# Patient Record
Sex: Male | Born: 1995 | Race: White | Hispanic: No | Marital: Single | Smoking: Never smoker
Health system: Southern US, Community
[De-identification: ages and names within clinical notes are randomized; demographics above are authoritative.]

## PROBLEM LIST (undated history)

## (undated) DIAGNOSIS — J45909 Unspecified asthma, uncomplicated: Secondary | ICD-10-CM

## (undated) DIAGNOSIS — Q780 Osteogenesis imperfecta: Secondary | ICD-10-CM

## (undated) HISTORY — PX: FRACTURE SURGERY: SHX138

## (undated) HISTORY — PX: KNEE SURGERY: SHX244

---
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
## 2019-01-08 ENCOUNTER — Other Ambulatory Visit: Payer: Self-pay

## 2019-01-08 ENCOUNTER — Emergency Department (HOSPITAL_COMMUNITY)
Admission: EM | Admit: 2019-01-08 | Discharge: 2019-01-08 | Disposition: A | Discharge status: Other Acute Inpatient Hospital | Payer: 59 | Attending: Emergency Medicine | Admitting: Emergency Medicine

## 2019-01-08 ENCOUNTER — Emergency Department (HOSPITAL_COMMUNITY): Payer: 59

## 2019-01-08 ENCOUNTER — Encounter (HOSPITAL_COMMUNITY): Payer: Self-pay | Admitting: Emergency Medicine

## 2019-01-08 DIAGNOSIS — M25561 Pain in right knee: Secondary | ICD-10-CM | POA: Diagnosis not present

## 2019-01-08 DIAGNOSIS — Y999 Unspecified external cause status: Secondary | ICD-10-CM | POA: Diagnosis not present

## 2019-01-08 DIAGNOSIS — W109XXA Fall (on) (from) unspecified stairs and steps, initial encounter: Secondary | ICD-10-CM | POA: Insufficient documentation

## 2019-01-08 DIAGNOSIS — S82452A Displaced comminuted fracture of shaft of left fibula, initial encounter for closed fracture: Secondary | ICD-10-CM | POA: Diagnosis not present

## 2019-01-08 DIAGNOSIS — Y92038 Other place in apartment as the place of occurrence of the external cause: Secondary | ICD-10-CM | POA: Insufficient documentation

## 2019-01-08 DIAGNOSIS — J45909 Unspecified asthma, uncomplicated: Secondary | ICD-10-CM | POA: Diagnosis not present

## 2019-01-08 DIAGNOSIS — Y9301 Activity, walking, marching and hiking: Secondary | ICD-10-CM | POA: Insufficient documentation

## 2019-01-08 DIAGNOSIS — S8992XA Unspecified injury of left lower leg, initial encounter: Secondary | ICD-10-CM | POA: Diagnosis present

## 2019-01-08 DIAGNOSIS — W19XXXA Unspecified fall, initial encounter: Secondary | ICD-10-CM

## 2019-01-08 MED ORDER — FENTANYL CITRATE (PF) 100 MCG/2ML IJ SOLN
INTRAMUSCULAR | Status: AC
  Filled 2019-01-08: qty 2

## 2019-01-08 MED ORDER — FENTANYL CITRATE (PF) 100 MCG/2ML IJ SOLN
75.0000 ug | INTRAMUSCULAR | Status: AC | PRN
  Administered 2019-01-08 (×3): 75 ug via INTRAVENOUS
  Filled 2019-01-08 (×2): qty 2

## 2019-01-08 NOTE — ED Triage Notes (Signed)
Pt comes to ed via ems, fall at 11 pm tonight ( miss judged the steps in his apartment walking  Down the stairs in the dark) . Pt has a hx of knee surgery, right side. Pt alert x 4. 20 in Left forearm, 50 mcg  fentyle given. Pain 5 out 10. Ems has knees bound because pt was shaking from pain.  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

## 2019-01-08 NOTE — ED Notes (Signed)
Spoke with Ed charge [facility]  [address], [place]  [phone] ---   Gave report/ they calling back to arrange transport

## 2019-01-08 NOTE — ED Provider Notes (Signed)
[facility]-EMERGENCY DEPT Provider Note   CSN: [id number] Arrival date & time: 01/08/19  0009     History   Chief Complaint Chief Complaint  Patient presents with   Fall    bilateral knee and ankle pain left     HPI William Mcgee is a 23 y.o. male with history of osteogenesis imperfecta Type 1 and asthma who presents to the emergency department by EMS with a chief complaint of fall that occurred just prior to arrival.  The patient reports that he was walking out of his apartment when he misjudged to the bottom of the steps and fell down several steps.  He has not been able to bear weight on the bilateral lower extremities since the injury.  He denies hitting his head, nausea, vomiting, or syncope.  He is having pain in his bilateral knees and left lower leg.  He denies numbness, weakness, hip pain, foot pain, back pain, or neck pain.  He was given 50 mcg of fentanyl in route by EMS.  EMS found his knees together because the patient was not shaking secondary to pain.  Denies alcohol or recreational drug use.  He is [facility], and his mother is requesting [facility].    The history is provided by the patient. No language interpreter was used.    Past Medical History:  Diagnosis Date   Asthma    Osteogenesis imperfecta     There are no active problems to display for this patient.   Past Surgical History:  Procedure Laterality Date   FRACTURE SURGERY          Home Medications    Prior to Admission medications   Not on File    Family History No family history on file.  Social History Social History   Tobacco Use   Smoking status: Never Smoker  Substance Use Topics   Alcohol use: Not on file   Drug use: Not on file     Allergies   Patient has no known allergies.   Review of Systems Review of Systems  Constitutional: Negative for appetite change and fever.  Respiratory: Negative for shortness of  breath.   Cardiovascular: Negative for chest pain.  Gastrointestinal: Negative for abdominal pain, diarrhea and vomiting.  Genitourinary: Negative for dysuria.  Musculoskeletal: Positive for arthralgias, gait problem and myalgias. Negative for back pain, joint swelling, neck pain and neck stiffness.  Skin: Positive for color change and wound. Negative for rash.  Allergic/Immunologic: Negative for immunocompromised state.  Neurological: Negative for dizziness, syncope, weakness, numbness and headaches.  Psychiatric/Behavioral: Negative for confusion.   Physical Exam Updated Vital Signs BP 125/82    Pulse 95    Temp 98.7 F (37.1 C)    Resp 19    SpO2 96%   Physical Exam Vitals signs and nursing note reviewed.  Constitutional:      Appearance: He is well-developed.  HENT:     Head: Normocephalic.  Eyes:     Conjunctiva/sclera: Conjunctivae normal.  Neck:     Musculoskeletal: Neck supple.  Cardiovascular:     Rate and Rhythm: Normal rate and regular rhythm.     Heart sounds: No murmur.  Pulmonary:     Effort: Pulmonary effort is normal.  Abdominal:     General: There is no distension.     Palpations: Abdomen is soft.  Musculoskeletal:        General: Tenderness present.     Comments: Range  of motion of the bilateral knees deferred secondary to pain.  Tender to palpation to the shaft of the left lower leg into the mid left thigh.  Diffusely tender to palpation over the left knee.  No obvious deformities.  Significantly tender to palpation to the right superior knee with disruption of the patella.  Significant pain with movement of the right knee.  No obvious deformity.  Bilateral ankles and hips are nontender to palpation.  DP and PT pulses are 2+ and symmetric.  Sensation is intact and equal to the bilateral lower extremities.  Independently moves all digits of the bilateral feet.  Skin:    General: Skin is warm and dry.  Neurological:     Mental Status: He is alert.    Psychiatric:        Behavior: Behavior normal.      ED Treatments / Results  Labs (all labs ordered are listed, but only abnormal results are displayed) Labs Reviewed - No data to display  EKG None  Radiology Dg Tibia/fibula Left  Result Date: 01/08/2019 CLINICAL DATA:  Fall EXAM: LEFT TIBIA AND FIBULA - 2 VIEW COMPARISON:  None. FINDINGS: Postsurgical changes at the proximal tibia. Infrapatellar soft tissue calcification, possible loose body. Acute comminuted fracture involving the midshaft of the fibula. 1/2 bone with medial displacement of distal fracture fragment. IMPRESSION: Acute comminuted and displaced fracture involving the midshaft of the fibula Electronically Signed   By: Donavan Foil M.D.   On: 01/08/2019 02:39   Dg Tibia/fibula Right  Result Date: 01/08/2019 CLINICAL DATA:  Fall EXAM: RIGHT TIBIA AND FIBULA - 2 VIEW COMPARISON:  None. FINDINGS: There is no evidence of fracture or other focal bone lesions. Soft tissues are unremarkable. Surgical anchors within the proximal tibia. Mild lateral deviation of the patella. IMPRESSION: No acute osseous abnormality.  Mild lateral deviation of the patella Electronically Signed   By: Donavan Foil M.D.   On: 01/08/2019 02:41   Dg Knee Complete 4 Views Left  Result Date: 01/08/2019 CLINICAL DATA:  Fall EXAM: LEFT KNEE - COMPLETE 4+ VIEW COMPARISON:  None FINDINGS: No fracture or malalignment. Surgical anchors in the proximal tibia. Small left knee effusion. Partially visualized comminuted fibular shaft fracture IMPRESSION: Partially visualized comminuted fibular shaft fracture. Small knee effusion Electronically Signed   By: Donavan Foil M.D.   On: 01/08/2019 02:35   Dg Knee Complete 4 Views Right  Addendum Date: 01/08/2019   ADDENDUM REPORT: 01/08/2019 02:41 ADDENDUM: Mild lateral deviation of the patella Electronically Signed   By: Donavan Foil M.D.   On: 01/08/2019 02:41   Result Date: 01/08/2019 CLINICAL DATA:  Fall EXAM:  RIGHT KNEE - COMPLETE 4+ VIEW COMPARISON:  11/29/2013 FINDINGS: Postsurgical changes of the proximal tibia. No acute displaced fracture or dislocation. Possible small calcified infrapatellar loose bodies. High-riding patella. Suspected small knee effusion IMPRESSION: 1. No acute osseous abnormality 2. High-riding patella. Possible small calcified infrapatellar loose bodies Electronically Signed: By: Donavan Foil M.D. On: 01/08/2019 02:37   Dg Femur Min 2 Views Left  Result Date: 01/08/2019 CLINICAL DATA:  Fall EXAM: LEFT FEMUR 2 VIEWS COMPARISON:  None. FINDINGS: There is no evidence of fracture or other focal bone lesions. Soft tissues are unremarkable. IMPRESSION: No acute osseous abnormality Electronically Signed   By: Donavan Foil M.D.   On: 01/08/2019 02:38   Dg Femur Min 2 Views Right  Result Date: 01/08/2019 CLINICAL DATA:  Fall EXAM: RIGHT FEMUR 2 VIEWS COMPARISON:  None. FINDINGS: There is  no evidence of fracture or other focal bone lesions. Soft tissues are unremarkable. High-riding patella at the knee. Small infrapatellar calcifications. IMPRESSION: No acute osseous abnormality. Electronically Signed   By: Jasmine PangKim  Fujinaga M.D.   On: 01/08/2019 02:40    Procedures Procedures (including critical care time)  Medications Ordered in ED Medications  fentaNYL (SUBLIMAZE) 100 MCG/2ML injection (  Not Given 01/08/19 0107)  fentaNYL (SUBLIMAZE) injection 75 mcg (75 mcg Intravenous Given 01/08/19 0539)     Initial Impression / Assessment and Plan / ED Course  I have reviewed the triage vital signs and the nursing notes.  Pertinent labs & imaging results that were available during my care of the patient were reviewed by me and considered in my medical decision making (see chart for details).        23 year old male with history of osteogenesis imperfecta Type 1 and asthma presenting by EMS after a fall from standing while walking down steps.  On exam, he is neurovascularly intact.  He is  tender to palpation over the left lower leg, left thigh, and over the right patella.  The patient was seen and independently evaluated by Dr. Preston FleetingGlick, attending physician.  Pain was initially controlled with fentanyl given by EMS, but pain is returning in the ER.  After 1 dose of fentanyl, pain has been well managed.  Imaging of the left fibula demonstrates an acute comminuted and displaced fracture involving the midshaft of the left fibula.  There is 1/2 bone width medial displacement of the distal fracture fragment.  Imaging of the right knee with high riding patella and suspected small knee effusion.  There are also possible small calcified infrapatellar loose bodies.  Imaging findings were discussed with the patient's mother who is requesting transfer to [facility] since the patient is established with orthopedic surgery at this facility.  We did discuss that orthopedic surgeons at our facility are able to care for the patient's injuries; however, she prefers the patient seek treatment with the orthopedic care team that he is already established with.  Spoke with [facility] and Dr. Delia ChimesSteven Vaslef, trauma surgery, will accept the patient for ED to ED transfer.  Per transfer coordinator, COVID-19 testing is not required at this time as he can [facility].    On re-evaluation to update the patient's mother, the patient's pain remains well controlled.  He remains hemodynamically stable and in no acute distress and safe for transfer to [facility] at this time.  Final Clinical Impressions(s) / ED Diagnoses   Final diagnoses:  Fall, initial encounter  Closed displaced comminuted fracture of shaft of left fibula, initial encounter  Acute pain of right knee    ED Discharge Orders    None       Barkley BoardsMcDonald, Santiago Graf A, PA-C 01/08/19 0612    Dione BoozeGlick, David, MD 01/08/19 [id number]    Dione BoozeGlick, David, MD 01/08/19 [phone]

## 2019-01-08 NOTE — ED Notes (Signed)
[facility] transport arrived  Pt moved over to Doctor, hospital signed by pt electronically

## 2019-07-09 ENCOUNTER — Emergency Department (HOSPITAL_COMMUNITY)
Admission: EM | Admit: 2019-07-09 | Discharge: 2019-07-09 | Disposition: A | Discharge status: Home / Self Care | Payer: 59 | Attending: Emergency Medicine | Admitting: Emergency Medicine

## 2019-07-09 ENCOUNTER — Emergency Department (HOSPITAL_COMMUNITY): Payer: 59

## 2019-07-09 ENCOUNTER — Other Ambulatory Visit: Payer: Self-pay

## 2019-07-09 DIAGNOSIS — Y939 Activity, unspecified: Secondary | ICD-10-CM | POA: Insufficient documentation

## 2019-07-09 DIAGNOSIS — M25561 Pain in right knee: Secondary | ICD-10-CM | POA: Insufficient documentation

## 2019-07-09 DIAGNOSIS — Y999 Unspecified external cause status: Secondary | ICD-10-CM | POA: Insufficient documentation

## 2019-07-09 DIAGNOSIS — W19XXXA Unspecified fall, initial encounter: Secondary | ICD-10-CM

## 2019-07-09 DIAGNOSIS — Y929 Unspecified place or not applicable: Secondary | ICD-10-CM | POA: Diagnosis not present

## 2019-07-09 DIAGNOSIS — W1830XA Fall on same level, unspecified, initial encounter: Secondary | ICD-10-CM | POA: Insufficient documentation

## 2019-07-09 DIAGNOSIS — R000 Tachycardia, unspecified: Secondary | ICD-10-CM | POA: Insufficient documentation

## 2019-07-09 DIAGNOSIS — J45909 Unspecified asthma, uncomplicated: Secondary | ICD-10-CM | POA: Insufficient documentation

## 2019-07-09 DIAGNOSIS — R52 Pain, unspecified: Secondary | ICD-10-CM

## 2019-07-09 DIAGNOSIS — M25571 Pain in right ankle and joints of right foot: Secondary | ICD-10-CM | POA: Diagnosis not present

## 2019-07-09 MED ORDER — OXYCODONE-ACETAMINOPHEN 5-325 MG PO TABS
1.0000 | ORAL_TABLET | Freq: Once | ORAL | Status: AC
  Administered 2019-07-09: 1 via ORAL
  Filled 2019-07-09: qty 1

## 2019-07-09 MED ORDER — OXYCODONE-ACETAMINOPHEN 5-325 MG PO TABS: 1.0000 | ORAL_TABLET | ORAL | 0 refills | Status: DC | PRN

## 2019-07-09 MED ORDER — FENTANYL CITRATE (PF) 100 MCG/2ML IJ SOLN
50.0000 ug | Freq: Once | INTRAMUSCULAR | Status: AC
  Administered 2019-07-09: 14:00:00 50 ug via INTRAVENOUS
  Filled 2019-07-09: qty 2

## 2019-07-09 MED ORDER — FENTANYL CITRATE (PF) 100 MCG/2ML IJ SOLN
50.0000 ug | Freq: Once | INTRAMUSCULAR | Status: AC
  Administered 2019-07-09: 50 ug via INTRAVENOUS
  Filled 2019-07-09: qty 2

## 2019-07-09 NOTE — ED Provider Notes (Signed)
[facility]-EMERGENCY DEPT Provider Note   CSN: [id number] Arrival date & time: 07/09/19  1224     History Chief Complaint  Patient presents with  . Knee Pain    William Mcgee is a 24 y.o. male.  The history is provided by the patient and medical records. No language interpreter was used.  Leg Pain Location:  Leg, knee and ankle Time since incident:  1 hour Injury: yes   Mechanism of injury: fall   Fall:    Fall occurred:  Standing   Impact surface:  Designer, fashion/clothing of impact:  Knees Leg location:  R lower leg Knee location:  R knee Ankle location:  R ankle Pain details:    Quality:  Aching   Radiates to:  Does not radiate   Severity:  Severe   Onset quality:  Sudden   Timing:  Constant   Progression:  Unchanged Chronicity:  Recurrent Prior injury to area:  Yes Relieved by:  Nothing Worsened by:  Bearing weight, extension, flexion and rotation Ineffective treatments:  None tried Associated symptoms: swelling   Associated symptoms: no back pain, no fatigue, no fever, no muscle weakness, no neck pain and no numbness   Risk factors: known bone disorder (osteogenesis imperfecta)        Past Medical History:  Diagnosis Date  . Asthma   . Osteogenesis imperfecta     There are no problems to display for this patient.   Past Surgical History:  Procedure Laterality Date  . FRACTURE SURGERY         No family history on file.  Social History   Tobacco Use  . Smoking status: Never Smoker  Substance Use Topics  . Alcohol use: Not on file  . Drug use: Not on file    Home Medications Prior to Admission medications   Not on File    Allergies    Patient has no known allergies.  Review of Systems   Review of Systems  Constitutional: Negative for chills, fatigue and fever.  HENT: Negative for congestion.   Respiratory: Negative for cough and chest tightness.   Cardiovascular: Negative for chest pain.  Gastrointestinal: Negative  for abdominal pain.  Genitourinary: Negative for flank pain.  Musculoskeletal: Negative for back pain, neck pain and neck stiffness.  Skin: Negative for rash and wound.  Neurological: Negative for headaches.  Psychiatric/Behavioral: Negative for agitation.  All other systems reviewed and are negative.   Physical Exam Updated Vital Signs BP (!) 142/89 (BP Location: Right Arm)   Pulse (!) 104   Temp 98.5 F (36.9 C)   Resp 16   SpO2 96%   Physical Exam Vitals and nursing note reviewed.  Constitutional:      General: He is not in acute distress.    Appearance: He is well-developed. He is not ill-appearing, toxic-appearing or diaphoretic.  HENT:     Head: Normocephalic and atraumatic.  Eyes:     Conjunctiva/sclera: Conjunctivae normal.  Cardiovascular:     Rate and Rhythm: Regular rhythm. Tachycardia present.     Heart sounds: No murmur.  Pulmonary:     Effort: Pulmonary effort is normal. No respiratory distress.     Breath sounds: Normal breath sounds.  Abdominal:     General: Abdomen is flat.     Palpations: Abdomen is soft.     Tenderness: There is no abdominal tenderness. There is no right CVA tenderness or left CVA tenderness.  Musculoskeletal:  General: Swelling, tenderness and signs of injury present.     Cervical back: Neck supple.       Legs:     Comments: Tenderness in the right knee, right shin, and right ankle.  Normal DP pulse.  Normal strength in the foot.  No tenderness in the hip.  No tenderness in the proximal thigh.  Scar on the knee but no laceration seen.  Swelling of the knee.  Skin:    General: Skin is warm and dry.     Capillary Refill: Capillary refill takes less than 2 seconds.     Findings: No erythema.  Neurological:     General: No focal deficit present.     Mental Status: He is alert.  Psychiatric:        Mood and Affect: Mood normal.     ED Results / Procedures / Treatments   Labs (all labs ordered are listed, but only abnormal  results are displayed) Labs Reviewed - No data to display  EKG None  Radiology DG Knee 1-2 Views Right  Result Date: 07/09/2019 CLINICAL DATA:  24 year old male with fall and right lower extremity pain. EXAM: RIGHT KNEE - 1-2 VIEW; RIGHT TIBIA AND FIBULA - 2 VIEW; RIGHT ANKLE - COMPLETE 3+ VIEW COMPARISON:  Left knee radiograph dated 11/29/2013. FINDINGS: No acute fracture identified. There is superiorly subluxed positioning of the patella. Clinical correlation is recommended. Anchor pins noted in the proximal tibia similar to prior radiograph. There is soft tissue swelling of the anterior knee and stranding and edema in the Hoffa's fat pad. A small suprapatellar effusion is noted. IMPRESSION: 1. No acute fracture. 2. Superiorly subluxed patella. 3. Soft tissue swelling of the anterior knee and stranding/edema of the Hoffa's fat pad. Electronically Signed   By: Anner Crete M.D.   On: 07/09/2019 15:28   DG Tibia/Fibula Right  Result Date: 07/09/2019 CLINICAL DATA:  24 year old male with fall and right lower extremity pain. EXAM: RIGHT KNEE - 1-2 VIEW; RIGHT TIBIA AND FIBULA - 2 VIEW; RIGHT ANKLE - COMPLETE 3+ VIEW COMPARISON:  Left knee radiograph dated 11/29/2013. FINDINGS: No acute fracture identified. There is superiorly subluxed positioning of the patella. Clinical correlation is recommended. Anchor pins noted in the proximal tibia similar to prior radiograph. There is soft tissue swelling of the anterior knee and stranding and edema in the Hoffa's fat pad. A small suprapatellar effusion is noted. IMPRESSION: 1. No acute fracture. 2. Superiorly subluxed patella. 3. Soft tissue swelling of the anterior knee and stranding/edema of the Hoffa's fat pad. Electronically Signed   By: Anner Crete M.D.   On: 07/09/2019 15:28   DG Ankle Complete Right  Result Date: 07/09/2019 CLINICAL DATA:  24 year old male with fall and right lower extremity pain. EXAM: RIGHT KNEE - 1-2 VIEW; RIGHT TIBIA AND  FIBULA - 2 VIEW; RIGHT ANKLE - COMPLETE 3+ VIEW COMPARISON:  Left knee radiograph dated 11/29/2013. FINDINGS: No acute fracture identified. There is superiorly subluxed positioning of the patella. Clinical correlation is recommended. Anchor pins noted in the proximal tibia similar to prior radiograph. There is soft tissue swelling of the anterior knee and stranding and edema in the Hoffa's fat pad. A small suprapatellar effusion is noted. IMPRESSION: 1. No acute fracture. 2. Superiorly subluxed patella. 3. Soft tissue swelling of the anterior knee and stranding/edema of the Hoffa's fat pad. Electronically Signed   By: Anner Crete M.D.   On: 07/09/2019 15:28    Procedures Procedures (including critical care time)  Medications Ordered in ED Medications  fentaNYL (SUBLIMAZE) injection 50 mcg (50 mcg Intravenous Given 07/09/19 1338)  fentaNYL (SUBLIMAZE) injection 50 mcg (50 mcg Intravenous Given 07/09/19 1425)  oxyCODONE-acetaminophen (PERCOCET/ROXICET) 5-325 MG per tablet 1 tablet (1 tablet Oral Given 07/09/19 1424)    ED Course  I have reviewed the triage vital signs and the nursing notes.  Pertinent labs & imaging results that were available during my care of the patient were reviewed by me and considered in my medical decision making (see chart for details).    MDM Rules/Calculators/A&P                      Jailin Moomaw is a 24 y.o. male with a past medical history significant for asthma and osteogenesis imperfecta who presents with right leg injury.  Patient reports that he had bilateral knee injury last year requiring surgery at [facility] and he says that he was just getting back to his baseline.  He reports he was walking carrying something having in the rain when he slipped and fell landing on his right knee shin and ankle bending backwards.  He reported immediate onset of pain and heard a loud pop.  He is concerned he hurt either his shin, knee, or ankle again.  He has not been able to bear  significant weight on it but he denies numbness or tingling.  He denies any weakness in the ankle or foot.  He denies lacerations or bleeding.  He denies any other injuries including no pain in his hips back head or chest.  He reports the pain is a 7 out of 10 in severity.  He does report the fentanyl helped him in the past with severe pain.  On exam, patient has swelling in his right knee with associated tenderness.  There was a scar in place that was tender.  No laceration seen.  Patient had tenderness down his tibia on the anterior side as well as pain in the ankle.  He did have palpable DP pulse as well as normal sensation and strength in the toes.  Lungs clear and chest nontender.  Abdomen nontender.  Low back nontender.  Hip nontender.  Had a shared decision-making conversation with patient we agreed to get x-rays of the knee, shin, and ankle.  Clinically I suspect he may have reinjured the knee or even displaced some hardware from his recent knee surgery last year at [facility].  Anticipate reassessment after his imaging to determine if he needs transfer to see orthopedics team at [facility] versus place in the knee immobilizer and that he can follow-up with his outpatient [facility] team.  Patient will be given fentanyl initially to help with the pain.  Of note, he denies any other symptoms including no recent Covid symptoms, fevers, chills, cough, or other complaints.  2:01 PM Patient's mother arrived and would like to take patient to [facility] today to see his orthopedic team as she is concerned that it would lead to repeat imaging today and later on this afternoon as she would anticipate transfer.  Patient is agreeable to this plan and he would rather get 1 more dose of pain medicine both through IV and orally and then knee immobilizer and crutches.  She will take him to [facility] for evaluation and imaging.  Patient agreed with this plan and I feel this is reasonable to help prevent radiation for this patient.  2:13 PM  I spoken with the mother who requested we contact [facility] to  let them know he would be coming.  This will likely end up being a transfer by personal vehicle but we will speak to [facility] orthopedics team to let them know he is coming.  2:39 PM Spoke with Dr. Elane Fritz at [facility] with orthopedics who requested images actually be performed here as if they are negative, he would likely be appropriate for outpatient follow-up with his orthopedics team rather than transfer tonight.  If there are any acute findings, they would agree with transfer for further advanced imaging and management.  Patient will get the x-rays and then we will discuss further management.  3:38 PM X-ray show evidence of superior subluxed patella likely indicating reinjury of the patient's patellar tendon.  Also soft tissue swelling but no acute fracture seen.  Tib-fib and ankle showed no acute injuries.  Will call back the [facility] transfer team as they requested a call after the images.  3:48 PM Just spoke with Dr. Elane Fritz again who recommends patient be discharged and then follows up with the [facility] orthopedics team as an outpatient. He said that if they needed to get seen tonight by the orthopedics team, they could come to the emergency department but it would not be as an official transfer. Family agrees with this plan. He will be given prescription for pain medicine and will be discharged.   Final Clinical Impression(s) / ED Diagnoses Final diagnoses:  Acute pain of right knee  Fall, initial encounter  Acute right ankle pain    Rx / DC Orders ED Discharge Orders         Ordered    oxyCODONE-acetaminophen (PERCOCET/ROXICET) 5-325 MG tablet  Every 4 hours PRN     07/09/19 1550          Clinical Impression: 1. Acute pain of right knee   2. Fall, initial encounter   3. Acute right ankle pain   4. Pain     Disposition: Discharge  Condition: Good  I have discussed the results, Dx and Tx plan with the pt(& family if  present). He/she/they expressed understanding and agree(s) with the plan. Discharge instructions discussed at great length. Strict return precautions discussed and pt &/or family have verbalized understanding of the instructions. No further questions at time of discharge.    New Prescriptions   OXYCODONE-ACETAMINOPHEN (PERCOCET/ROXICET) 5-325 MG TABLET    Take 1 tablet by mouth every 4 (four) hours as needed for severe pain.    Follow Up: Your [facility] orthopedics team        Bentley Haralson, Canary Brim, MD 07/09/19 [phone]

## 2019-07-09 NOTE — ED Triage Notes (Signed)
Pt reports pain to the right knee. Patient reports he fell and heard a pop

## 2019-07-09 NOTE — Discharge Instructions (Addendum)
Your images today showed evidence of a superiorly located patella which could possibly indicate reinjury of her patellar tendon. There was no fracture or dislocation of the other bones. Your ankle did not show fracture or dislocation. After our discussion with the [facility] orthopedics team, they felt it was appropriate for you to be discharged with a prescription for pain medicine, knee immobilization, crutches, and follow-up with your team as an outpatient however they also said that if you need to go to the emergency department at [facility], you could do so but not as an official transfer. After our discussion, we feel this is reasonable. If any symptoms change or worsen acutely, please return to nearest emergency department.

## 2019-07-09 NOTE — ED Notes (Signed)
Dr. Rush Landmark has spoke with a member of the [facility] orthopedics team. Apparently, patient is not being discharged or transferred until x-ray images are performed.

## 2019-08-09 ENCOUNTER — Ambulatory Visit: Payer: 59

## 2019-08-09 ENCOUNTER — Ambulatory Visit: Payer: 59 | Attending: Internal Medicine

## 2019-08-09 DIAGNOSIS — Z23 Encounter for immunization: Secondary | ICD-10-CM

## 2019-08-09 NOTE — Progress Notes (Signed)
   Covid-19 Vaccination Clinic  Name:  William Mcgee    MRN: [id number] DOB: 07/20/1995  08/09/2019  Mr. Memoli was observed post Covid-19 immunization for 15 minutes without incident. He was provided with Vaccine Information Sheet and instruction to access the V-Safe system.   Mr. Malecha was instructed to call 911 with any severe reactions post vaccine: Marland Kitchen Difficulty breathing  . Swelling of face and throat  . A fast heartbeat  . A bad rash all over body  . Dizziness and weakness   Immunizations Administered    Name Date Dose VIS Date Route   Pfizer COVID-19 Vaccine 08/09/2019  4:04 PM 0.3 mL 04/27/2019 Intramuscular   Manufacturer: [employer], [employer]   Lot: [id number]   NDC: [address]-0

## 2019-09-03 ENCOUNTER — Ambulatory Visit: Payer: 59 | Attending: Internal Medicine

## 2019-09-03 DIAGNOSIS — Z23 Encounter for immunization: Secondary | ICD-10-CM

## 2019-09-03 NOTE — Progress Notes (Signed)
   Covid-19 Vaccination Clinic  Name:  William Mcgee    MRN: [id number] DOB: [birth year]  09/03/2019  [name] was observed post Covid-19 immunization for 15 minutes without incident. He was provided with Vaccine Information Sheet and instruction to access the V-Safe system.   [name] was instructed to call 911 with any severe reactions post vaccine: Marland Kitchen Difficulty breathing  . Swelling of face and throat  . A fast heartbeat  . A bad rash all over body  . Dizziness and weakness   Immunizations Administered    Name Date Dose VIS Date Route   Pfizer COVID-19 Vaccine 09/03/2019  1:42 PM 0.3 mL 07/11/2018 Intramuscular   Manufacturer: [employer], [employer]   Lot: [id number]   NDC: [address]-1

## 2021-05-22 ENCOUNTER — Ambulatory Visit
Admission: EM | Admit: 2021-05-22 | Discharge: 2021-05-22 | Disposition: A | Discharge status: Home / Self Care | Payer: 59

## 2021-05-22 ENCOUNTER — Other Ambulatory Visit: Payer: Self-pay

## 2021-05-22 DIAGNOSIS — Z1152 Encounter for screening for COVID-19: Secondary | ICD-10-CM | POA: Diagnosis not present

## 2021-05-22 DIAGNOSIS — B349 Viral infection, unspecified: Secondary | ICD-10-CM | POA: Diagnosis not present

## 2021-05-22 LAB — POCT FASTING CBG KUC MANUAL ENTRY: POCT Glucose (KUC): 113 mg/dL — AB (ref 70–99)

## 2021-05-22 NOTE — ED Provider Notes (Signed)
[facility]    CSN: [id number] Arrival date & time: 05/22/21  1258      History   Chief Complaint Chief Complaint  Patient presents with   Shortness of Breath   Dizziness    HPI William Mcgee is a 26 y.o. male with history of asthma presents to urgent care today with complaints of dizziness and fatigue.  Patient states he has had extremely busy at work since after the holidays and has noticed some fatigue and occasional shortness of breath.  Patient states symptoms occur with little exertion and associated with occasional dizziness.  Patient states dizziness occurs when sitting to standing and when focusing intently on phone or computer.  Patient reports mild congestion and phlegm in chest, headache.  He denies any facial pain, change in smell or taste, cough, ST, abdominal pain, N/V/D, recent fever or chills.  Patient reports history of asthma.  He has used inhaler twice over the last few days with maybe some relief in symptoms.  Patient concerned he has COVID due to recent exposure.   Past Medical History:  Diagnosis Date   Asthma    Osteogenesis imperfecta     There are no problems to display for this patient.   Past Surgical History:  Procedure Laterality Date   FRACTURE SURGERY         Home Medications    Prior to Admission medications   Medication Sig Start Date End Date Taking? Authorizing Provider  albuterol (VENTOLIN HFA) 108 (90 Base) MCG/ACT inhaler Inhale into the lungs. 07/01/20 07/01/21 Yes [provider]  methylphenidate 54 MG PO CR tablet Take 54 mg by mouth every morning. 04/30/21   [provider]  oxyCODONE-acetaminophen (PERCOCET/ROXICET) 5-325 MG tablet Take 1 tablet by mouth every 4 (four) hours as needed for severe pain. 07/09/19   Tegeler, Canary Brim, MD    Family History History reviewed. No pertinent family history.  Social History Social History   Tobacco Use   Smoking status: Never     Allergies    Patient has no known allergies.   Review of Systems As stated in HPI otherwise negative   Physical Exam Triage Vital Signs ED Triage Vitals  Enc Vitals Group     BP 05/22/21 1425 129/72     Pulse Rate 05/22/21 1425 63     Resp 05/22/21 1425 18     Temp 05/22/21 1425 98 F (36.7 C)     Temp Source 05/22/21 1425 Oral     SpO2 05/22/21 1425 99 %     Weight --      Height --      Head Circumference --      Peak Flow --      Pain Score 05/22/21 1423 2     Pain Loc --      Pain Edu? --      Excl. in [place]? --    No data found.  Updated Vital Signs BP 129/72 (BP Location: Left Arm)    Pulse 63    Temp 98 F (36.7 C) (Oral)    Resp 18    SpO2 99%   Visual Acuity Right Eye Distance:   Left Eye Distance:   Bilateral Distance:    Right Eye Near:   Left Eye Near:    Bilateral Near:     Physical Exam Constitutional:      General: He is not in acute distress.    Appearance: He is well-developed and normal weight. He  is not ill-appearing or toxic-appearing.  HENT:     Mouth/Throat:     Mouth: Mucous membranes are moist.     Pharynx: No pharyngeal swelling or oropharyngeal exudate.  Eyes:     Extraocular Movements: Extraocular movements intact.  Cardiovascular:     Rate and Rhythm: Normal rate and regular rhythm.     Heart sounds: No murmur heard.   No friction rub. No gallop.  Pulmonary:     Effort: Pulmonary effort is normal. No tachypnea, accessory muscle usage or respiratory distress.     Breath sounds: Normal breath sounds. No stridor.  Abdominal:     General: Bowel sounds are normal.     Palpations: Abdomen is soft.  Musculoskeletal:        General: Normal range of motion.     Cervical back: Normal range of motion.  Lymphadenopathy:     Cervical: No cervical adenopathy.  Skin:    General: Skin is warm and dry.  Neurological:     General: No focal deficit present.     Mental Status: He is alert and oriented to person, place, and time.  Psychiatric:         Behavior: Behavior normal.     UC Treatments / Results  Labs (all labs ordered are listed, but only abnormal results are displayed) Labs Reviewed  POCT FASTING CBG [facility] MANUAL ENTRY - Abnormal; Notable for the following components:      Result Value   POCT Glucose ([facility]) 113 (*)    All other components within normal limits  NOVEL CORONAVIRUS, NAA   Narrative:    Performed at:  [address] [place], [place]  [id number] Lab Director: Jolene Schimke MD, Phone:  [phone]  SARS-COV-2, NAA 2 DAY TAT   Narrative:    Performed at:  [address] [facility] [address], [place], [place]  [id number] Lab Director: Jolene Schimke MD, Phone:  [phone]    EKG Sinus rhythm at 63 bpm, no acute ischemia, no T wave inversion, no reciprocal changes  Radiology No results found.  Procedures Procedures (including critical care time)  Medications Ordered in UC Medications - No data to display  Initial Impression / Assessment and Plan / UC Course  I have reviewed the triage vital signs and the nursing notes.  Pertinent labs & imaging results that were available during my care of the patient were reviewed by me and considered in my medical decision making (see chart for details).  Dizziness, fatigue -Symptoms ongoing x1 week with associated congestion and cough along with increased job stress -Exam reassuring.  No evidence of bacterial etiology or acute asthma exacerbation requiring antibiotics or steroid treatment -CBG and EKG reassuring -COVID PCR testing sent.  Otherwise, rest, push fluids symptomatic treatment with Tylenol and/or Motrin, okay to use albuterol as previously prescribed. -Follow-up PCP 1 to 2 weeks or sooner for persistent or worsening symptoms  Reviewed expections re: course of current medical issues. Questions answered. Outlined signs and symptoms indicating need for more acute intervention. Pt verbalized understanding. AVS given  Final  Clinical Impressions(s) / UC Diagnoses   Final diagnoses:  Viral illness  Encounter for screening for COVID-19     Discharge Instructions      I am reassured today by your exam and EKG.  I suspect you have a viral illness that does not require any antibiotics.  Your COVID PCR testing has been sent.  Results should be available on your MyChart in 2 to  3 days.  Otherwise, rest, drink plenty of fluids and continue vitamins as prior to visit.  Please follow-up for any worsening or persistent symptoms     ED Prescriptions   None    PDMP not reviewed this encounter.   Rolla EtienneSmith, Callaway Hardigree E, NP 05/25/21 [phone]

## 2021-05-22 NOTE — Discharge Instructions (Addendum)
I am reassured today by your exam and EKG.  I suspect you have a viral illness that does not require any antibiotics.  Your COVID PCR testing has been sent.  Results should be available on your MyChart in 2 to 3 days.  Otherwise, rest, drink plenty of fluids and continue vitamins as prior to visit.  Please follow-up for any worsening or persistent symptoms

## 2021-05-22 NOTE — ED Triage Notes (Signed)
Pt reports he has asthma, and states he began having chest tightness, dizziness and some SOB with activity. Patient concerned that he has covid.

## 2021-05-23 LAB — SARS-COV-2, NAA 2 DAY TAT

## 2021-05-23 LAB — NOVEL CORONAVIRUS, NAA: SARS-CoV-2, NAA: NOT DETECTED

## 2021-06-01 ENCOUNTER — Emergency Department (INDEPENDENT_AMBULATORY_CARE_PROVIDER_SITE_OTHER)
Admission: RE | Admit: 2021-06-01 | Discharge: 2021-06-01 | Disposition: A | Discharge status: Home / Self Care | Payer: PRIVATE HEALTH INSURANCE | Source: Ambulatory Visit

## 2021-06-01 ENCOUNTER — Other Ambulatory Visit: Payer: Self-pay

## 2021-06-01 ENCOUNTER — Ambulatory Visit: Payer: Self-pay

## 2021-06-01 VITALS — BP 142/78 | HR 79 | Temp 98.2°F | Resp 18 | Wt 186.0 lb

## 2021-06-01 DIAGNOSIS — N4889 Other specified disorders of penis: Secondary | ICD-10-CM

## 2021-06-01 NOTE — ED Triage Notes (Signed)
Pt states he has had a small red spot on his right testicle for months. Last night felt like it popped and he started having more pain and swelling.

## 2021-06-01 NOTE — Discharge Instructions (Addendum)
The bump that you are worried about is benign and should not cause any problems.  Try to leave it alone

## 2021-06-01 NOTE — ED Provider Notes (Signed)
[name]    CSN: [id number] Arrival date & time: 06/01/21  1441      History   Chief Complaint Chief Complaint  Patient presents with   Groin Pain    HPI William Mcgee is a 26 y.o. male.   HPI  Patient has a bump on his penis that he is concerned about.  Its been there for some time but he popped it yesterday and now is more sore.  No dysuria.  No concern for sexually transmitted disease.  No penile discharge  Past Medical History:  Diagnosis Date   Asthma    Osteogenesis imperfecta     There are no problems to display for this patient.   Past Surgical History:  Procedure Laterality Date   FRACTURE SURGERY     KNEE SURGERY         Home Medications    Prior to Admission medications   Medication Sig Start Date End Date Taking? Authorizing Provider  albuterol (VENTOLIN HFA) 108 (90 Base) MCG/ACT inhaler Inhale into the lungs. 07/01/20 07/01/21  [provider]  methylphenidate 54 MG PO CR tablet Take 54 mg by mouth every morning. 04/30/21   [provider]    Family History History reviewed. No pertinent family history.  Social History Social History   Tobacco Use   Smoking status: Never  Vaping Use   Vaping Use: Never used  Substance Use Topics   Alcohol use: Not Currently   Drug use: Not Currently     Allergies   Patient has no known allergies.   Review of Systems Review of Systems See HPI  Physical Exam Triage Vital Signs ED Triage Vitals  Enc Vitals Group     BP 06/01/21 1502 (!) 142/78     Pulse Rate 06/01/21 1502 79     Resp 06/01/21 1502 18     Temp 06/01/21 1502 (!) 79 F (26.1 C)     Temp Source 06/01/21 1502 Oral     SpO2 06/01/21 1502 98 %     Weight 06/01/21 1506 186 lb (84.4 kg)     Height --      Head Circumference --      Peak Flow --      Pain Score 06/01/21 1505 2     Pain Loc --      Pain Edu? --      Excl. in [place]? --    No data found.  Updated Vital Signs BP (!) 142/78 (BP  Location: Right Arm)    Pulse 79    Temp 98.2 F (36.8 C) (Oral)    Resp 18    Wt 84.4 kg    SpO2 98%       Physical Exam Constitutional:      General: He is not in acute distress.    Appearance: He is well-developed.  HENT:     Head: Normocephalic and atraumatic.  Eyes:     Conjunctiva/sclera: Conjunctivae normal.     Pupils: Pupils are equal, round, and reactive to light.  Cardiovascular:     Rate and Rhythm: Normal rate.  Pulmonary:     Effort: Pulmonary effort is normal. No respiratory distress.  Abdominal:     General: There is no distension.     Palpations: Abdomen is soft.  Genitourinary:    Pubic Area: No rash.      Penis: Normal and uncircumcised.      Testes: Normal.        Right:  Mass or tenderness not present.        Left: Mass or tenderness not present.     Epididymis:     Right: Normal.     Left: Normal.     Tanner stage (genital): 5.     Comments: There are 2 small round pearly nodules on the underside of the penis near the base. Musculoskeletal:        General: Normal range of motion.     Cervical back: Normal range of motion.  Skin:    General: Skin is warm and dry.  Neurological:     Mental Status: He is alert.     UC Treatments / Results  Labs (all labs ordered are listed, but only abnormal results are displayed) Labs Reviewed - No data to display  EKG   Radiology No results found.  Procedures Procedures (including critical care time)  Medications Ordered in UC Medications - No data to display  Initial Impression / Assessment and Plan / UC Course  I have reviewed the triage vital signs and the nursing notes.  Pertinent labs & imaging results that were available during my care of the patient were reviewed by me and considered in my medical decision making (see chart for details).    Final Clinical Impressions(s) / UC Diagnoses   Final diagnoses:  Sebaceous cyst of penis     Discharge Instructions      The bump that you are  worried about is benign and should not cause any problems.  Try to leave it alone   ED Prescriptions   None    PDMP not reviewed this encounter.   Raylene Everts, MD 06/01/21 [phone]

## 2021-07-13 IMAGING — DX DG TIBIA/FIBULA 2V*R*
2 series · 2 of 2 positions shown · non-contrast
Comparison: Left knee radiograph dated 11/29/2013.

CLINICAL DATA: 23-year-old male with fall and right lower extremity
pain.

EXAM:
RIGHT KNEE - 1-2 VIEW; RIGHT TIBIA AND FIBULA - 2 VIEW; RIGHT ANKLE
- COMPLETE 3+ VIEW

[tibia ap]
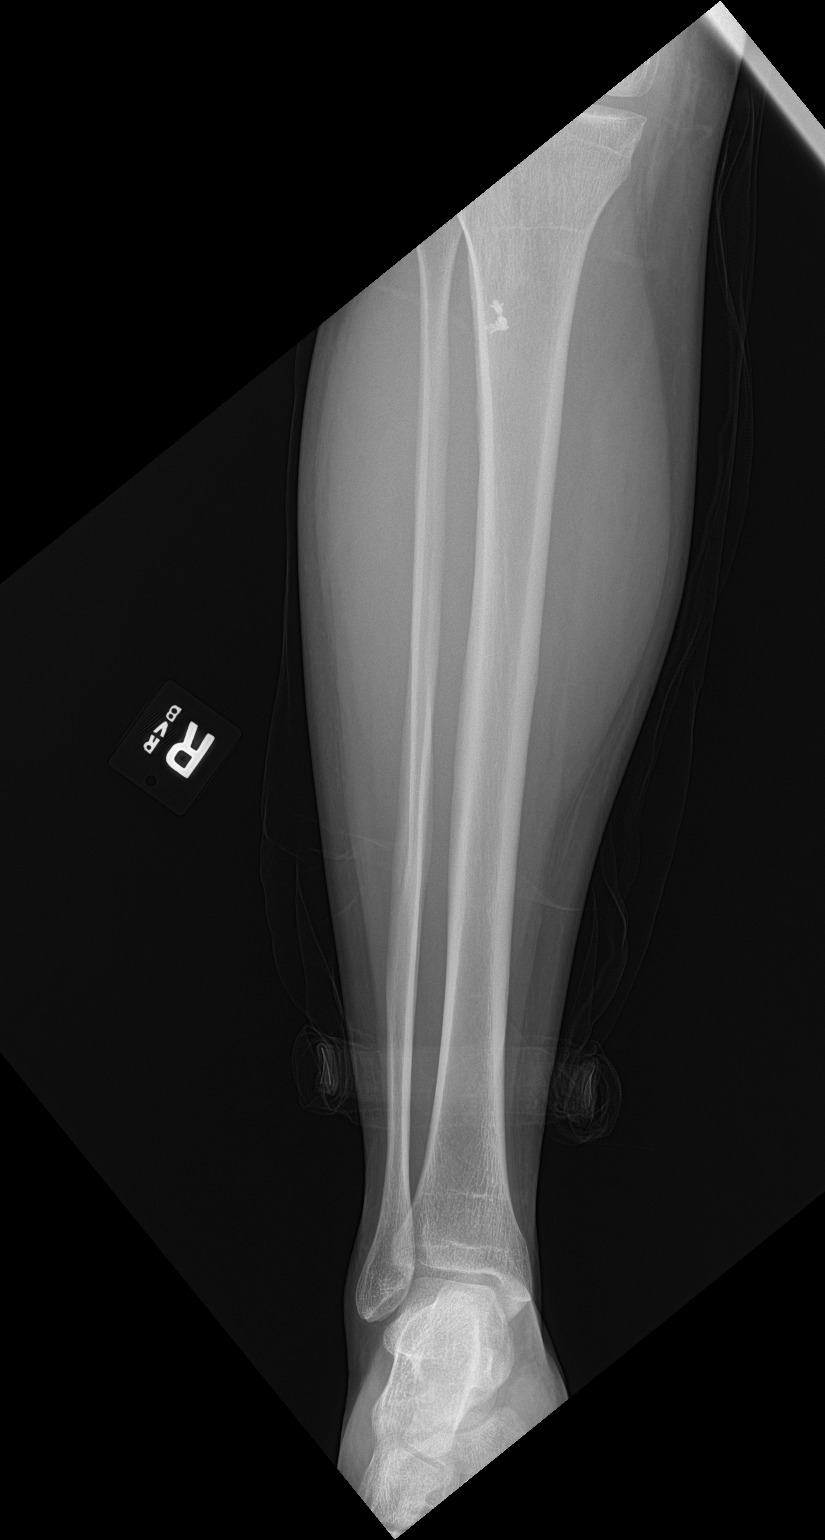

[tibia lat]
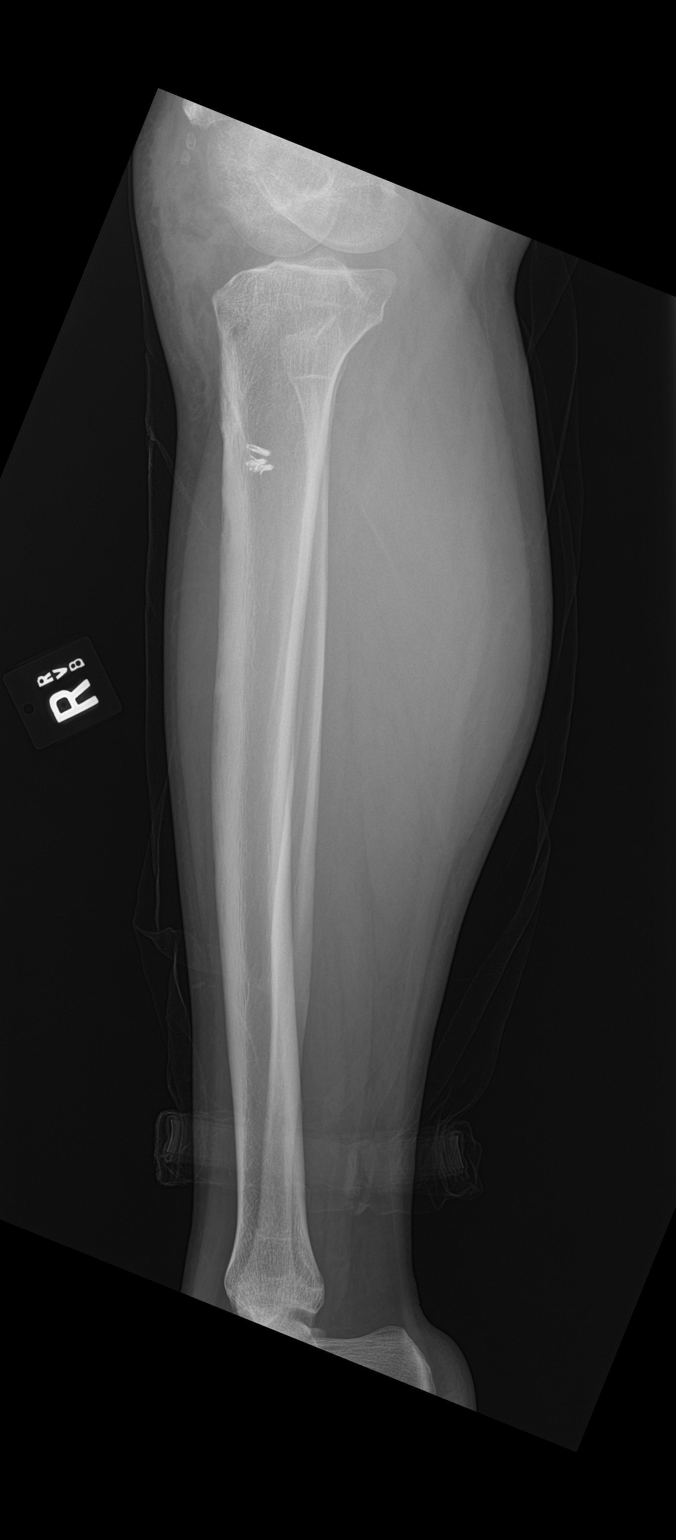

[2 of 2 positions shown; findings below may reference images not displayed]

FINDINGS: No acute fracture identified. There is superiorly subluxed
positioning of the patella. Clinical correlation is recommended.
Anchor pins noted in the proximal tibia similar to prior radiograph.
There is soft tissue swelling of the anterior knee and stranding and
edema in the Hoffa's fat pad. A small suprapatellar effusion is
noted.
IMPRESSION: 1. No acute fracture.
2. Superiorly subluxed patella.
3. Soft tissue swelling of the anterior knee and stranding/edema of
the Hoffa's fat pad.

## 2021-07-13 IMAGING — DX DG ANKLE COMPLETE 3+V*R*
3 series · 3 of 3 positions shown · non-contrast
Comparison: Left knee radiograph dated 11/29/2013.

CLINICAL DATA: 23-year-old male with fall and right lower extremity
pain.

EXAM:
RIGHT KNEE - 1-2 VIEW; RIGHT TIBIA AND FIBULA - 2 VIEW; RIGHT ANKLE
- COMPLETE 3+ VIEW

[ankle ap]
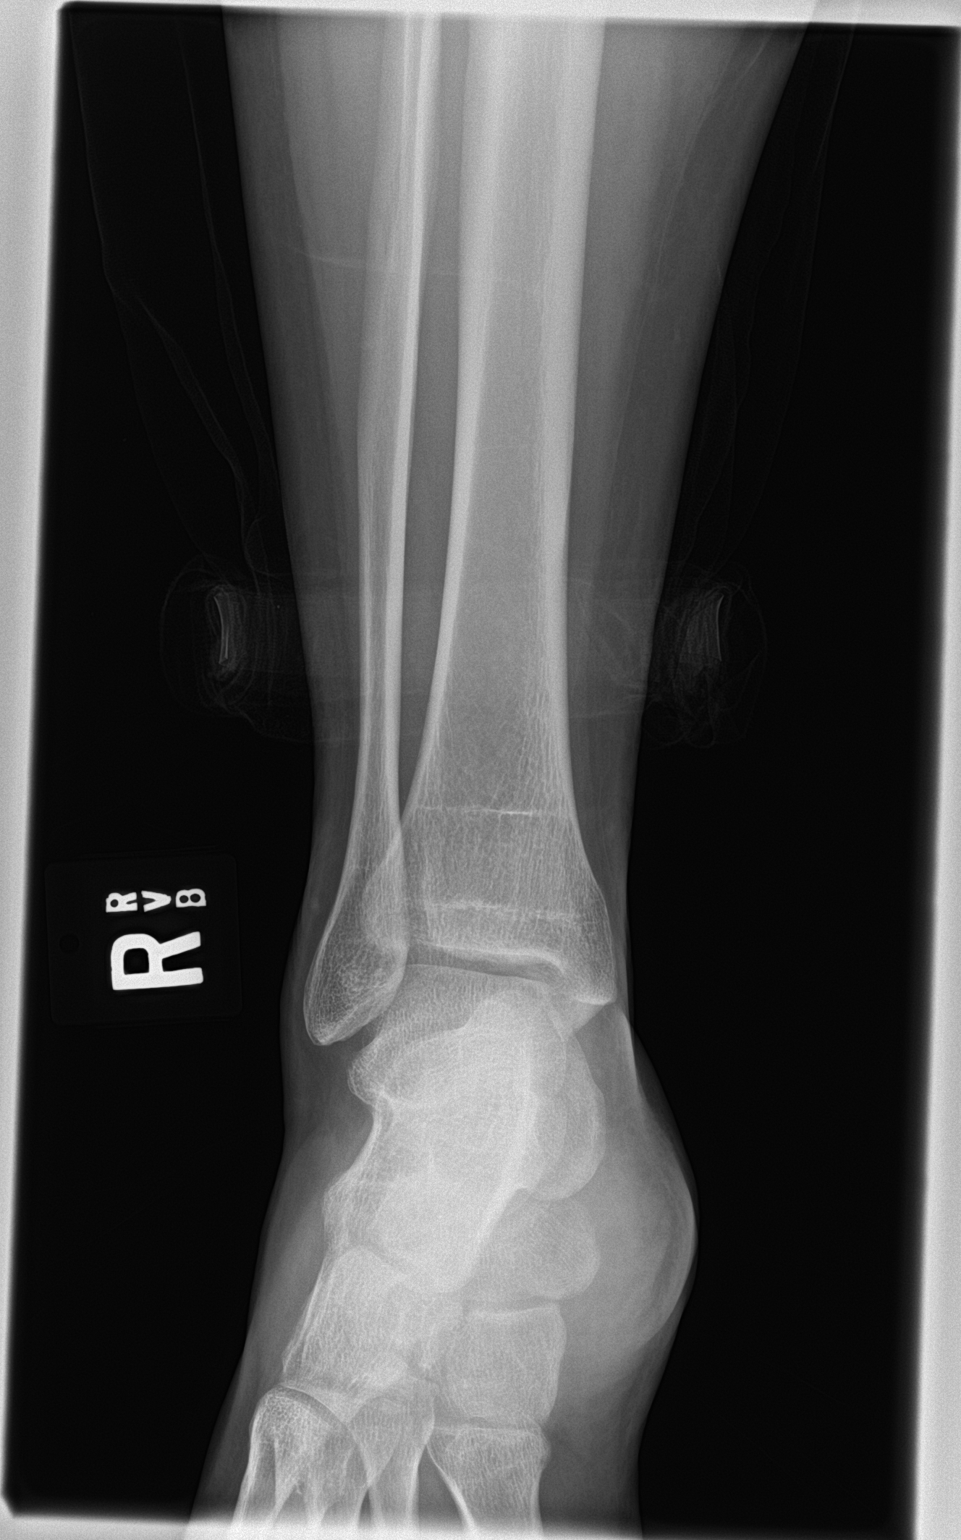

[ankle obl]
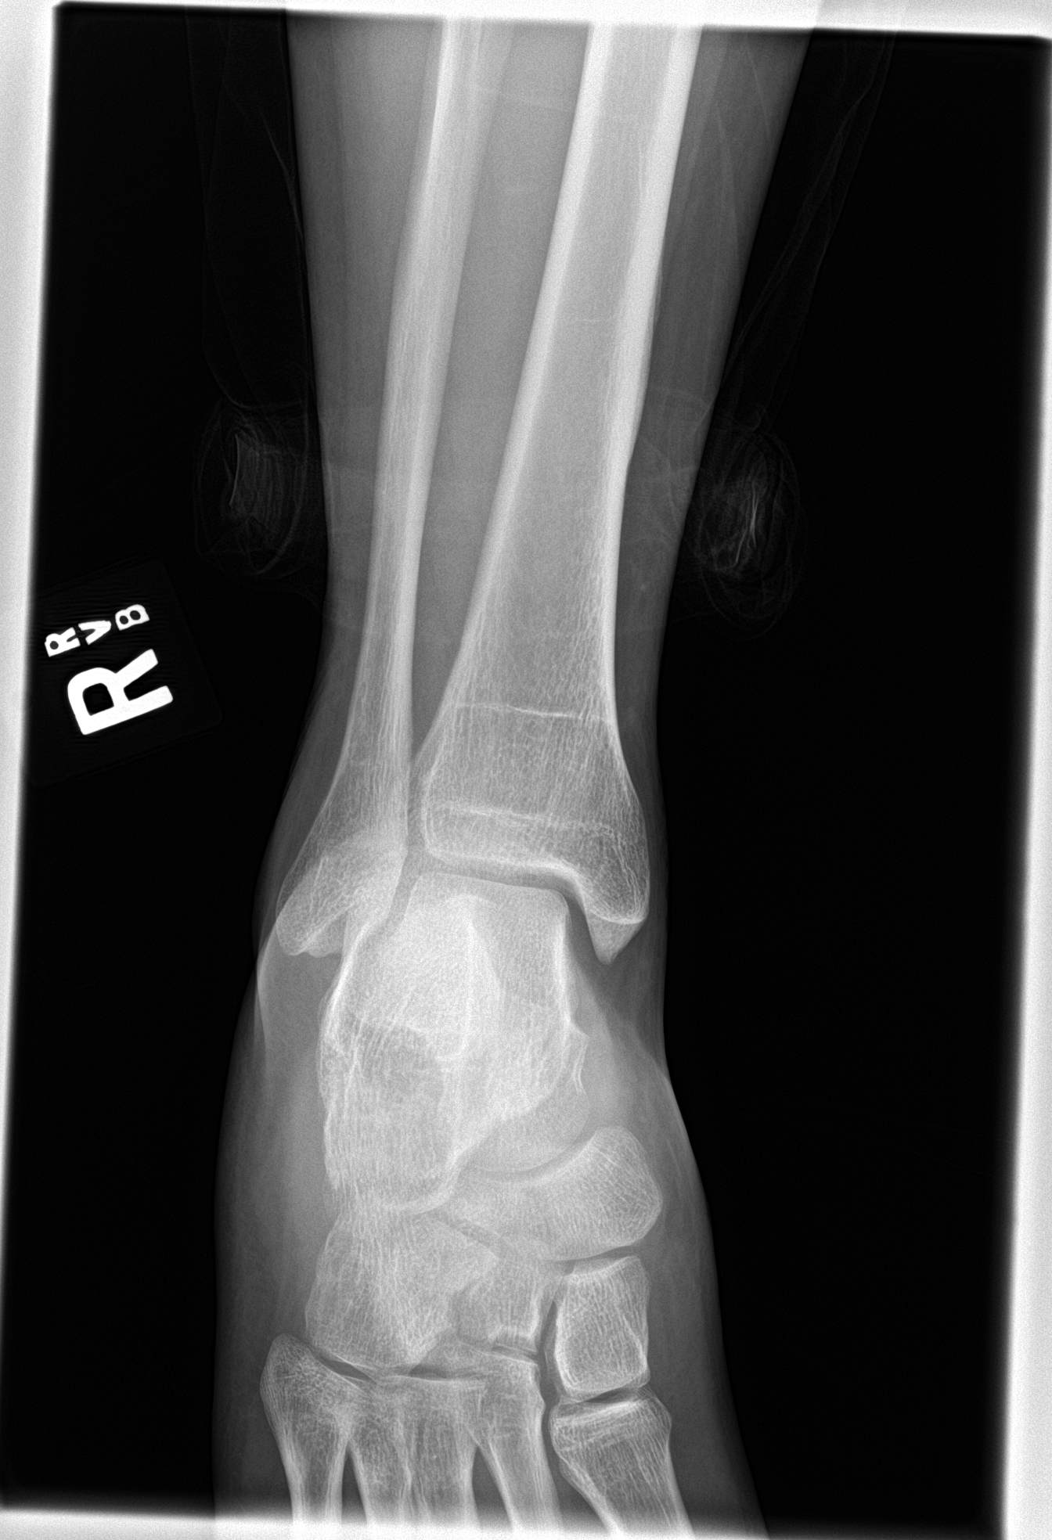

[ankle lat]
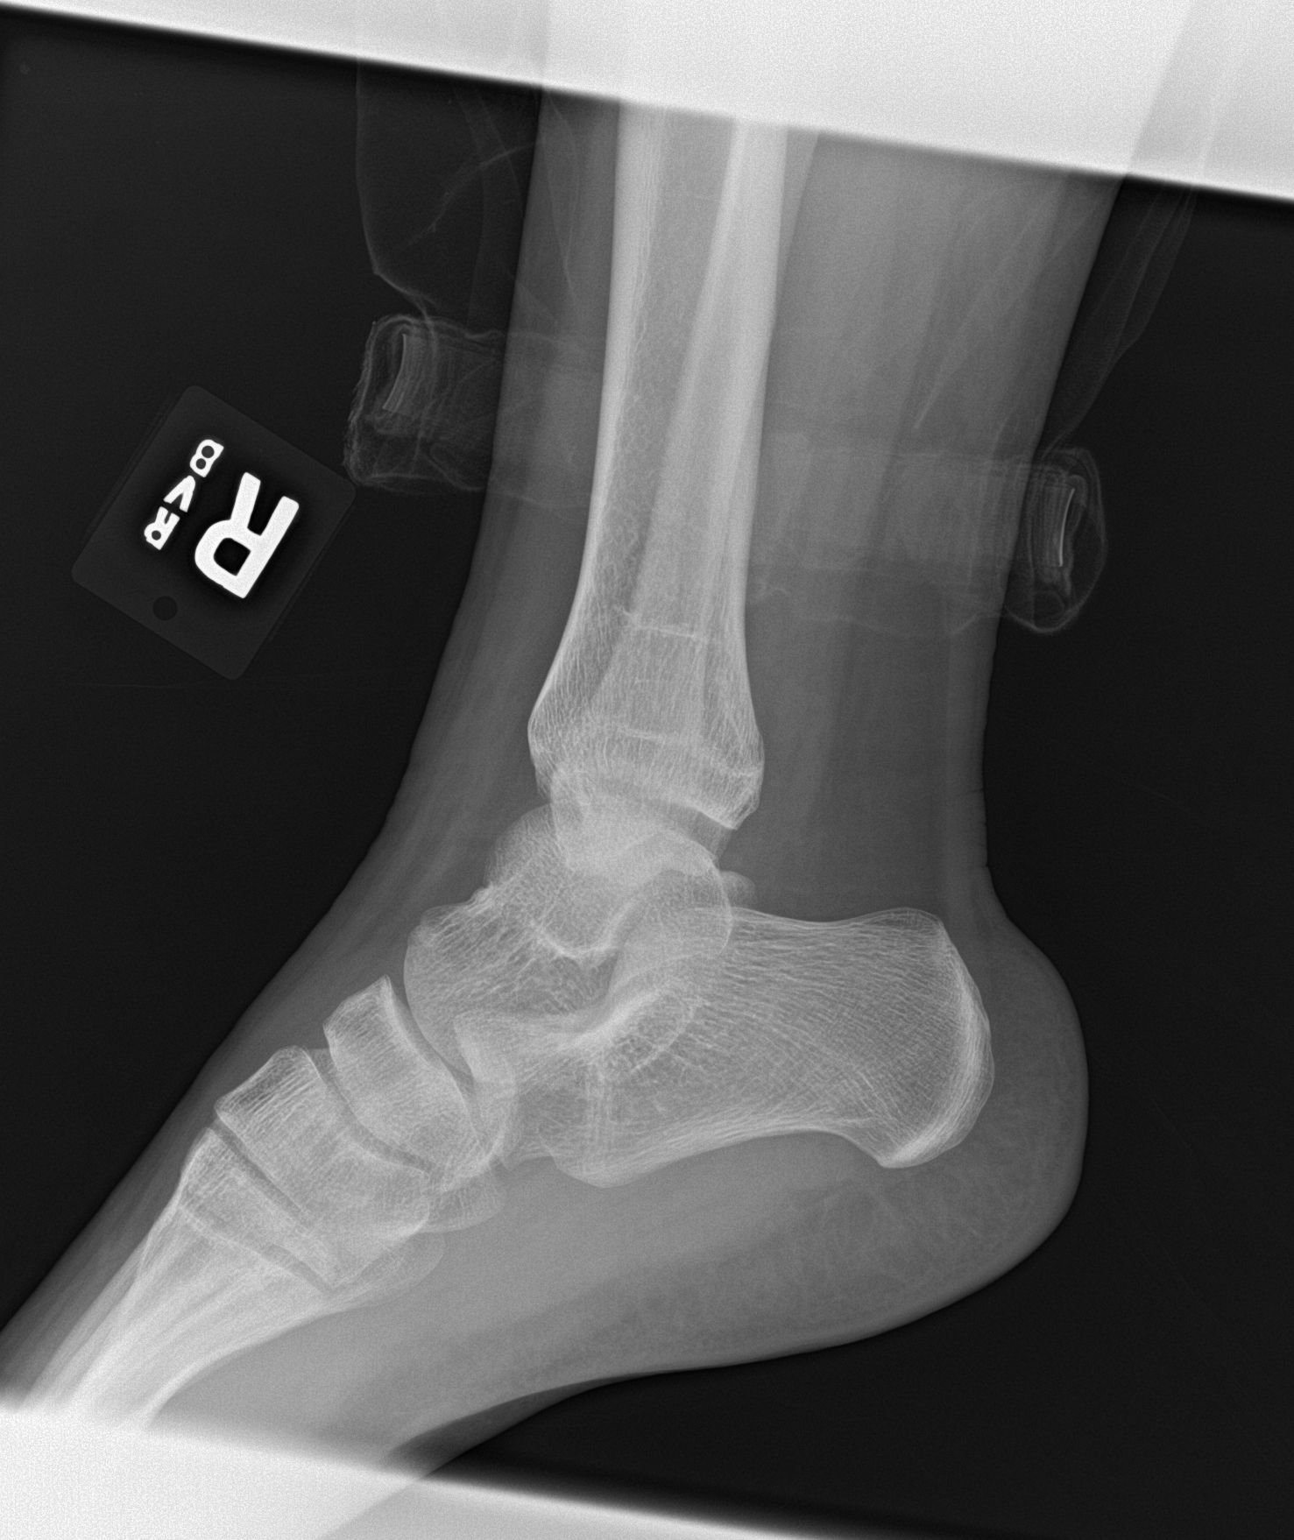

[3 of 3 positions shown; findings below may reference images not displayed]

FINDINGS: No acute fracture identified. There is superiorly subluxed
positioning of the patella. Clinical correlation is recommended.
Anchor pins noted in the proximal tibia similar to prior radiograph.
There is soft tissue swelling of the anterior knee and stranding and
edema in the Hoffa's fat pad. A small suprapatellar effusion is
noted.
IMPRESSION: 1. No acute fracture.
2. Superiorly subluxed patella.
3. Soft tissue swelling of the anterior knee and stranding/edema of
the Hoffa's fat pad.

## 2021-11-25 ENCOUNTER — Ambulatory Visit
Admission: RE | Admit: 2021-11-25 | Discharge: 2021-11-25 | Disposition: A | Discharge status: Home / Self Care | Payer: 59 | Source: Ambulatory Visit

## 2021-11-25 VITALS — BP 130/81 | HR 74 | Temp 98.7°F | Resp 18

## 2021-11-25 DIAGNOSIS — R112 Nausea with vomiting, unspecified: Secondary | ICD-10-CM | POA: Diagnosis not present

## 2021-11-25 NOTE — ED Provider Notes (Signed)
William Mcgee    CSN: [id number] Arrival date & time: 11/25/21  1034      History   Chief Complaint Chief Complaint  Patient presents with   Nausea    I went into work yesterday and was very nauseous after being behind on taking a medication. However I threw up several times which hasn't happened before and I still feel nauseous today 7/12 - Entered by patient    HPI William Mcgee is a 26 y.o. male.   HPI Patient presents today with ongoing nausea after an episode of vomiting twice yesterday.  Patient reports missing 1 dose of his Lexapro and initially thought this may be the cause of the nausea. He also endorses increase stress over the last 3 months.  He has had no diarrhea, no abdominal pain and denies any recent illness.  He did feel clammy with mild dizziness yesterday and was sent home from work due to his symptoms.  He was able to eat dinner but has had some intermittent nausea.  Being that he was sent home from working this today patient is reporting that he will need a work excuse.  He has been negative of vomiting today although continues to have some nausea following eating breakfast this morning. Past Medical History:  Diagnosis Date   Asthma    Osteogenesis imperfecta     There are no problems to display for this patient.   Past Surgical History:  Procedure Laterality Date   FRACTURE SURGERY     KNEE SURGERY         Home Medications    Prior to Admission medications   Medication Sig Start Date End Date Taking? Authorizing Provider  escitalopram (LEXAPRO) 10 MG tablet Take 10 mg by mouth daily. 11/23/21  Yes [provider]  methylphenidate 54 MG PO CR tablet Take 54 mg by mouth every morning. 04/30/21  Yes [provider]  albuterol (VENTOLIN HFA) 108 (90 Base) MCG/ACT inhaler Inhale into the lungs. 07/01/20 07/01/21  [provider]    Family History History reviewed. No pertinent family history.  Social History Social  History   Tobacco Use   Smoking status: Never   Smokeless tobacco: Never  Vaping Use   Vaping Use: Some days  Substance Use Topics   Alcohol use: Yes   Drug use: Not Currently     Allergies   Patient has no known allergies.   Review of Systems Review of Systems Pertinent negatives listed in HPI  Physical Exam Triage Vital Signs ED Triage Vitals  Enc Vitals Group     BP 11/25/21 1105 130/81     Pulse Rate 11/25/21 1105 74     Resp 11/25/21 1105 18     Temp 11/25/21 1105 98.7 F (37.1 C)     Temp Source 11/25/21 1105 Oral     SpO2 11/25/21 1105 98 %     Weight --      Height --      Head Circumference --      Peak Flow --      Pain Score 11/25/21 1104 0     Pain Loc --      Pain Edu? --      Excl. in [place]? --    No data found.  Updated Vital Signs BP 130/81 (BP Location: Left Arm)   Pulse 74   Temp 98.7 F (37.1 C) (Oral)   Resp 18   SpO2 98%   Visual Acuity Right Eye  Distance:   Left Eye Distance:   Bilateral Distance:    Right Eye Near:   Left Eye Near:    Bilateral Near:     Physical Exam Vitals reviewed.  Constitutional:      Appearance: Normal appearance.  Cardiovascular:     Rate and Rhythm: Normal rate and regular rhythm.  Pulmonary:     Effort: Pulmonary effort is normal.     Breath sounds: Normal breath sounds and air entry.  Neurological:     Mental Status: He is alert.  Psychiatric:        Attention and Perception: Attention and perception normal.        Mood and Affect: Mood is anxious.        Speech: Speech normal.        Behavior: Behavior is cooperative.        Thought Content: Thought content normal.        Cognition and Memory: Cognition and memory normal.        Judgment: Judgment normal.    UC Treatments / Results  Labs (all labs ordered are listed, but only abnormal results are displayed) Labs Reviewed - No data to display  EKG   Radiology No results found.  Procedures Procedures (including critical care  time)  Medications Ordered in UC Medications - No data to display  Initial Impression / Assessment and Plan / UC Course  I have reviewed the triage vital signs and the nursing notes.  Pertinent labs & imaging results that were available during my care of the patient were reviewed by me and considered in my medical decision making (see chart for details).    Nausea and vomiting in adult,no concern for an acute abdomen as abdominal exam is unremarkable.  Suspect if history of anxiety that nausea and vomiting is likely psychogenic.  Advised patient if the symptoms continue to follow-up with his mental health medication prescriber as he has been on his Lexapro for over 6 months and he may need a titration in dose for better management of his anxiety. Work note provided. Patient declined Zofran. Final Clinical Impressions(s) / UC Diagnoses   Final diagnoses:  Nausea and vomiting in adult   Discharge Instructions   None    ED Prescriptions   None    PDMP not reviewed this encounter.   Bing Neighbors, FNP 11/25/21 1152

## 2021-11-25 NOTE — ED Triage Notes (Signed)
Pt c/o nausea that started yesterday. He vomited twice and thinks it due to the fact that he didn't take his lexapro as scheduled.

## 2023-04-19 ENCOUNTER — Ambulatory Visit: Payer: BC Managed Care – PPO | Admitting: Urology

## 2023-04-19 ENCOUNTER — Ambulatory Visit
Admission: EM | Admit: 2023-04-19 | Discharge: 2023-04-19 | Disposition: A | Discharge status: Home / Self Care | Payer: PRIVATE HEALTH INSURANCE | Attending: Emergency Medicine | Admitting: Emergency Medicine

## 2023-04-19 ENCOUNTER — Encounter: Payer: Self-pay | Admitting: Urology

## 2023-04-19 VITALS — BP 130/76 | HR 73 | Ht 67.0 in | Wt 210.0 lb

## 2023-04-19 DIAGNOSIS — J069 Acute upper respiratory infection, unspecified: Secondary | ICD-10-CM

## 2023-04-19 DIAGNOSIS — N529 Male erectile dysfunction, unspecified: Secondary | ICD-10-CM

## 2023-04-19 DIAGNOSIS — J029 Acute pharyngitis, unspecified: Secondary | ICD-10-CM | POA: Diagnosis not present

## 2023-04-19 DIAGNOSIS — R6882 Decreased libido: Secondary | ICD-10-CM | POA: Diagnosis not present

## 2023-04-19 LAB — GROUP A STREP BY PCR: Group A Strep by PCR: NOT DETECTED

## 2023-04-19 LAB — SARS CORONAVIRUS 2 BY RT PCR: SARS Coronavirus 2 by RT PCR: NEGATIVE

## 2023-04-19 MED ORDER — TADALAFIL 5 MG PO TABS: 2.5000 mg | ORAL_TABLET | Freq: Every day | ORAL | 6 refills | Status: DC

## 2023-04-19 MED ORDER — CLOMIPHENE CITRATE 50 MG PO TABS: 25.0000 mg | ORAL_TABLET | Freq: Every day | ORAL | 6 refills | Status: DC

## 2023-04-19 MED ORDER — DOXYCYCLINE HYCLATE 100 MG PO CAPS: 100.0000 mg | ORAL_CAPSULE | Freq: Two times a day (BID) | ORAL | 0 refills | Status: AC

## 2023-04-19 NOTE — Patient Instructions (Signed)
Please stop by our lab a week before your next appointment, labs must be drawn in the morning prior to 10:30am. Our lab is walk in only therefore no appointment is needed, the lab is located in [address] and is open 8am - 5pm.

## 2023-04-19 NOTE — Discharge Instructions (Signed)
Take antibiotic as directed. Rest,push fluids, follow up with PCP. May take OTC meds for symptom management(chloraseptic throat lozenges, dayquil,nyquil,etc). Return as needed.

## 2023-04-19 NOTE — ED Provider Notes (Signed)
[facility]    CSN: [id number] Arrival date & time: 04/19/23  1144      History   Chief Complaint Chief Complaint  Patient presents with   Sore Throat   Chills    HPI William Mcgee is a 27 y.o. male.   27 year old male pt, William Mcgee, presents to urgent care for evaluation of sore throat and chills for 6 days.  Patient states he has had some nasal congestion as well as sinus pressure.  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  The history is provided by the patient. No language interpreter was used.    Past Medical History:  Diagnosis Date   Asthma    Osteogenesis imperfecta     Patient Active Problem List   Diagnosis Date Noted   Acute URI 04/19/2023   Sore throat 04/19/2023    Past Surgical History:  Procedure Laterality Date   FRACTURE SURGERY     KNEE SURGERY         Home Medications    Prior to Admission medications   Medication Sig Start Date End Date Taking? Authorizing Provider  clomiPHENE (CLOMID) 50 MG tablet Take 0.5 tablets (25 mg total) by mouth daily. 04/19/23  Yes Sondra Come, MD  doxycycline (VIBRAMYCIN) 100 MG capsule Take 1 capsule (100 mg total) by mouth 2 (two) times daily for 7 days. 04/19/23 04/26/23 Yes [place] Yearby, Para March, NP  escitalopram (LEXAPRO) 10 MG tablet Take 10 mg by mouth daily. 11/23/21  Yes [provider]  methylphenidate 54 MG PO CR tablet Take 54 mg by mouth every morning. 04/30/21  Yes [provider]  pantoprazole (PROTONIX) 40 MG tablet Take 40 mg by mouth daily. 09/14/22 09/14/23 Yes [provider]  sildenafil (REVATIO) 20 MG tablet Take 60-100 mg by mouth as needed.   Yes [provider]  tadalafil (CIALIS) 5 MG tablet Take 0.5 tablets (2.5 mg total) by mouth daily. 04/19/23  Yes Sondra Come, MD    Family History History reviewed. No pertinent family history.  Social History Social History   Tobacco Use   Smoking status:  Never    Passive exposure: Never   Smokeless tobacco: Never  Vaping Use   Vaping status: Some Days  Substance Use Topics   Alcohol use: Yes   Drug use: Not Currently     Allergies   Patient has no known allergies.   Review of Systems Review of Systems  Constitutional:  Positive for chills and fever.  HENT:  Positive for congestion, postnasal drip, sinus pressure, sinus pain and sore throat.   Respiratory:  Positive for cough.   All other systems reviewed and are negative.    Physical Exam Triage Vital Signs ED Triage Vitals  Encounter Vitals Group     BP 04/19/23 1153 (!) 144/75     Systolic BP Percentile --      Diastolic BP Percentile --      Pulse Rate 04/19/23 1153 70     Resp 04/19/23 1153 16     Temp 04/19/23 1153 98.5 F (36.9 C)     Temp Source 04/19/23 1153 Oral     SpO2 04/19/23 1153 97 %     Weight 04/19/23 1152 210 lb (95.3 kg)     Height 04/19/23 1152 5\' 7"  (1.702 m)     Head Circumference --      Peak Flow --      Pain Score 04/19/23 1155  6     Pain Loc --      Pain Education --      Exclude from Growth Chart --    No data found.  Updated Vital Signs BP (!) 144/75 (BP Location: Left Arm)   Pulse 70   Temp 98.5 F (36.9 C) (Oral)   Resp 16   Ht 5\' 7"  (1.702 m)   Wt 210 lb (95.3 kg)   SpO2 97%   BMI 32.89 kg/m   Visual Acuity Right Eye Distance:   Left Eye Distance:   Bilateral Distance:    Right Eye Near:   Left Eye Near:    Bilateral Near:     Physical Exam Vitals and nursing note reviewed.  Constitutional:      General: He is not in acute distress.    Appearance: He is well-developed. He is not ill-appearing or toxic-appearing.  HENT:     Head: Normocephalic.     Right Ear: Tympanic membrane is retracted.     Left Ear: Tympanic membrane is retracted.     Nose: Mucosal edema and congestion present.     Mouth/Throat:     Lips: Pink.     Mouth: Mucous membranes are moist.     Pharynx: Oropharynx is clear. Uvula midline.   Eyes:     General: Lids are normal.     Conjunctiva/sclera: Conjunctivae normal.     Pupils: Pupils are equal, round, and reactive to light.  Cardiovascular:     Rate and Rhythm: Normal rate and regular rhythm.     Heart sounds: Normal heart sounds.  Pulmonary:     Effort: Pulmonary effort is normal. No respiratory distress.     Breath sounds: Normal breath sounds and air entry. No decreased breath sounds or wheezing.  Abdominal:     General: There is no distension.     Palpations: Abdomen is soft.  Musculoskeletal:        General: Normal range of motion.     Cervical back: Normal range of motion.  Skin:    General: Skin is warm and dry.     Findings: No rash.  Neurological:     General: No focal deficit present.     Mental Status: He is alert and oriented to person, place, and time.     GCS: GCS eye subscore is 4. GCS verbal subscore is 5. GCS motor subscore is 6.     Cranial Nerves: No cranial nerve deficit.     Sensory: No sensory deficit.  Psychiatric:        Speech: Speech normal.        Behavior: Behavior normal. Behavior is cooperative.      UC Treatments / Results  Labs (all labs ordered are listed, but only abnormal results are displayed) Labs Reviewed  GROUP A STREP BY PCR  SARS CORONAVIRUS 2 BY RT PCR    EKG   Radiology No results found.  Procedures Procedures (including critical care time)  Medications Ordered in UC Medications - No data to display  Initial Impression / Assessment and Plan / UC Course  I have reviewed the triage vital signs and the nursing notes.  Pertinent labs & imaging results that were available during my care of the patient were reviewed by me and considered in my medical decision making (see chart for details).    Discussed exam findings and plan of care with patient, strict go to ER precautions given.   Patient verbalized understanding to this provider.  Ddx: Acute URI, sore throat, allergies, viral illness Final  Clinical Impressions(s) / UC Diagnoses   Final diagnoses:  Acute URI  Sore throat     Discharge Instructions      Take antibiotic as directed. Rest,push fluids, follow up with PCP. May take OTC meds for symptom management(chloraseptic throat lozenges, dayquil,nyquil,etc). Return as needed.      ED Prescriptions     Medication Sig Dispense Auth. Provider   doxycycline (VIBRAMYCIN) 100 MG capsule Take 1 capsule (100 mg total) by mouth 2 (two) times daily for 7 days. 14 capsule Ellana Kawa, Para March, NP      PDMP not reviewed this encounter.   Clancy Gourd, NP 04/19/23 1311

## 2023-04-19 NOTE — Progress Notes (Signed)
   04/19/23 11:04 AM   Gilmer Mor 05-31-1995 [id number]  CC: ED, borderline testosterone values  HPI: 27 year old male who reports about a year of ED and low libido.  Things improved somewhat after decreasing stress and change in a relationship.  He has been on sildenafil through PCP with improvement in his ED.  He had borderline testosterone levels of 335 in May 2024 and 392 in October 2024.  He does not think he is interested in future pregnancies at this time.  He does extensive weight training.  He is on Lexapro and methylphenidate.   PMH: Past Medical History:  Diagnosis Date   Asthma    Osteogenesis imperfecta     Surgical History: Past Surgical History:  Procedure Laterality Date   FRACTURE SURGERY     KNEE SURGERY       Social History:  reports that he has never smoked. He has never been exposed to tobacco smoke. He has never used smokeless tobacco. He reports current alcohol use. He reports that he does not currently use drugs.  Physical Exam: BP 130/76   Pulse 73   Ht 5\' 7"  (1.702 m)   Wt 210 lb (95.3 kg)   BMI 32.89 kg/m    Constitutional:  Alert and oriented, No acute distress. Cardiovascular: No clubbing, cyanosis, or edema. Respiratory: Normal respiratory effort, no increased work of breathing. GI: Abdomen is soft, nontender, nondistended, no abdominal masses   Laboratory Data: Reviewed, see HPI   Assessment & Plan:   27 year old male with low libido and ED and borderline testosterone levels of 335 and 392.  We reviewed the AUA guidelines regarding testosterone evaluation and management, and that 2 values > 300 are required to initiate testosterone replacement.  We reviewed the risks and benefits of Clomid extensively, and he was strongly interested in a trial of Clomid.  He was also interested in a trial of Cialis daily to replace the sildenafil.  We also reviewed behavioral and dietary strategies to improve testosterone levels.  Trial of Clomid 25  mg daily and Cialis 2.5 mg daily RTC 6 weeks testosterone, estradiol, LH prior  Legrand Rams, MD 04/19/2023  [facility] [address] [place], [place] [address] [phone]

## 2023-04-19 NOTE — ED Triage Notes (Signed)
Pt c/o sore throat & chills x6 days.

## 2023-05-02 NOTE — Telephone Encounter (Signed)
Pt called triage stating that he took 1 whole tab of Cialis 5mg . He also states the pharmacy only gave him 15 tablets, I advised he would need to speak to [facility] because Dr. Richardo Hanks sent in 30 tablets, and he has refills.

## 2023-05-16 ENCOUNTER — Ambulatory Visit (INDEPENDENT_AMBULATORY_CARE_PROVIDER_SITE_OTHER): Payer: BC Managed Care – PPO

## 2023-05-16 ENCOUNTER — Ambulatory Visit
Admission: EM | Admit: 2023-05-16 | Discharge: 2023-05-16 | Disposition: A | Discharge status: Home / Self Care | Payer: BC Managed Care – PPO | Attending: Emergency Medicine | Admitting: Emergency Medicine

## 2023-05-16 DIAGNOSIS — R051 Acute cough: Secondary | ICD-10-CM

## 2023-05-16 DIAGNOSIS — R058 Other specified cough: Secondary | ICD-10-CM

## 2023-05-16 DIAGNOSIS — J4521 Mild intermittent asthma with (acute) exacerbation: Secondary | ICD-10-CM | POA: Diagnosis not present

## 2023-05-16 MED ORDER — PROMETHAZINE-DM 6.25-15 MG/5ML PO SYRP: 5.0000 mL | ORAL_SOLUTION | Freq: Four times a day (QID) | ORAL | 0 refills | Status: AC | PRN

## 2023-05-16 MED ORDER — AEROCHAMBER MV MISC: 1 refills | Status: AC

## 2023-05-16 MED ORDER — PREDNISONE 10 MG (21) PO TBPK: ORAL_TABLET | ORAL | 0 refills | Status: AC

## 2023-05-16 MED ORDER — ALBUTEROL SULFATE HFA 108 (90 BASE) MCG/ACT IN AERS: 2.0000 | INHALATION_SPRAY | RESPIRATORY_TRACT | 0 refills | Status: AC | PRN

## 2023-05-16 NOTE — ED Provider Notes (Incomplete)
HPI  SUBJECTIVE:  William Mcgee is a 27 y.o. male who presents with ***  Patient was seen here on 12/3 for URI, COVID, flu negative.  Sent home on 1 week of doxycycline.  Patient has a past medical history of asthma and osteogenesis imperfecta  Past Medical History:  Diagnosis Date   Asthma    Osteogenesis imperfecta     Past Surgical History:  Procedure Laterality Date   FRACTURE SURGERY     KNEE SURGERY      Family History  Problem Relation Age of Onset   Asthma Mother    Cancer Mother     Social History   Tobacco Use   Smoking status: Never    Passive exposure: Never   Smokeless tobacco: Never  Vaping Use   Vaping status: Some Days  Substance Use Topics   Alcohol use: Yes    Comment: occ   Drug use: Not Currently    No current facility-administered medications for this encounter.  Current Outpatient Medications:    clomiPHENE (CLOMID) 50 MG tablet, Take 0.5 tablets (25 mg total) by mouth daily., Disp: 30 tablet, Rfl: 6   escitalopram (LEXAPRO) 10 MG tablet, Take 10 mg by mouth daily., Disp: , Rfl:    methylphenidate 54 MG PO CR tablet, Take 54 mg by mouth every morning., Disp: , Rfl:    tadalafil (CIALIS) 5 MG tablet, Take 0.5 tablets (2.5 mg total) by mouth daily., Disp: 30 tablet, Rfl: 6   pantoprazole (PROTONIX) 40 MG tablet, Take 40 mg by mouth daily., Disp: , Rfl:    sildenafil (REVATIO) 20 MG tablet, Take 60-100 mg by mouth as needed., Disp: , Rfl:   No Known Allergies   ROS  As noted in HPI.   Physical Exam  BP (!) 114/49 (BP Location: Right Arm)   Pulse 78   Temp 98.6 F (37 C) (Oral)   Resp 18   SpO2 97%  *** Constitutional: Well developed, well nourished, no acute distress Eyes:  EOMI, conjunctiva normal bilaterally HENT: Normocephalic, atraumatic,mucus membranes moist.  No nasal congestion.  No maxillary, frontal sinus tenderness.  No postnasal drip. Respiratory: Normal inspiratory effort, diffuse expiratory wheezing, worse on the  left, prolonged expiratory phase.  No anterior, lateral chest wall tenderness Cardiovascular: Normal rate, regular rhythm, no murmurs rubs or gallops GI: nondistended skin: No rash, skin intact Musculoskeletal: no deformities Neurologic: Alert & oriented x 3, no focal neuro deficits Psychiatric: Speech and behavior appropriate   ED Course   Medications - No data to display  No orders of the defined types were placed in this encounter.   No results found for this or any previous visit (from the past 24 hours). No results found.  ED Clinical Impression  No diagnosis found.   ED Assessment/Plan   {The patient has been seen in Urgent Care in the last 3 years. :1}  Previous records reviewed.  As noted in HPI.  Will get chest x-ray to rule out some residual pneumonia given duration of cough and asymmetric wheezing..  I suspect asthma exacerbation from recent URI.  Home with 6 day prednisone taper, regularly scheduled albuterol inhaler with a spacer for 4 days, then as needed thereafter, Promethazine DM.  Work note for today.  Reviewed imaging independently.  No acute cardiopulmonary disease as read by me formal radiology report pending.  Discussed with patient that we will contact him if radiology overread differs enough from mine and we need to change management.   Reviewed  radiology report.  No pneumonia consistent with my read.  See radiology report for full details.  Plan as above.  Discussed  imaging, MDM, treatment plan, and plan for follow-up with patient. Discussed sn/sx that should prompt return to the ED. patient agrees with plan.   No orders of the defined types were placed in this encounter.     *This clinic note was created using Dragon dictation software. Therefore, there may be occasional mistakes despite careful proofreading.  ?

## 2023-05-16 NOTE — ED Triage Notes (Signed)
Pt seen here 12/03 for URI.  Had some improvement with abx but has never fully recovered.  Reports continued dry, raspy cough, fatigue, and mild SOB esp with exertion.    Has been using Sudafed with no improvement.

## 2023-05-16 NOTE — Discharge Instructions (Addendum)
I did not appreciate any pneumonia on your x-ray.  We will contact you if radiology sees a pneumonia, and we will: The appropriate medication.  In the meantime, 2 puffs from your albuterol inhaler using your spacer every 4 hours for 2 days, then every 6 hours for 2 days, then as needed.  Finish the prednisone, even if you feel better.  Promethazine DM for cough.  Marland Kitchen

## 2023-05-17 ENCOUNTER — Encounter: Payer: Self-pay | Admitting: Emergency Medicine

## 2023-06-06 ENCOUNTER — Other Ambulatory Visit: Payer: Self-pay | Admitting: Urology

## 2023-06-06 NOTE — Addendum Note (Signed)
Addended by: Marchelle Folks on: 06/06/2023 08:38 AM   Modules accepted: Orders

## 2023-06-07 ENCOUNTER — Ambulatory Visit: Payer: BC Managed Care – PPO | Admitting: Urology

## 2023-06-07 ENCOUNTER — Encounter: Payer: Self-pay | Admitting: Urology

## 2023-06-07 VITALS — BP 129/70 | HR 84 | Ht 66.0 in | Wt 219.0 lb

## 2023-06-07 DIAGNOSIS — N529 Male erectile dysfunction, unspecified: Secondary | ICD-10-CM

## 2023-06-07 DIAGNOSIS — R6882 Decreased libido: Secondary | ICD-10-CM | POA: Diagnosis not present

## 2023-06-07 LAB — ESTRADIOL: Estradiol: 71.1 pg/mL — ABNORMAL HIGH (ref 7.6–42.6)

## 2023-06-07 LAB — TESTOSTERONE: Testosterone: 793 ng/dL (ref 264–916)

## 2023-06-07 LAB — LUTEINIZING HORMONE: LH: 11 m[IU]/mL — ABNORMAL HIGH (ref 1.7–8.6)

## 2023-06-07 MED ORDER — ANASTROZOLE 1 MG PO TABS: 1.0000 mg | ORAL_TABLET | ORAL | 6 refills | Status: DC

## 2023-06-07 MED ORDER — TADALAFIL 5 MG PO TABS: 5.0000 mg | ORAL_TABLET | Freq: Every day | ORAL | 3 refills | Status: DC

## 2023-06-07 NOTE — Patient Instructions (Signed)
Your prescriptions were sent to [email or web address], you can go to the website and set up a free account.  Their prices are typically significantly less expensive than local pharmacies or other online retailers.  We will recheck blood work and 8 weeks and message you with those results.

## 2023-06-07 NOTE — Progress Notes (Signed)
   06/07/2023 10:40 AM   Gilmer Mor 12-15-1995 [id number]  Reason for visit: Follow up low libido, ED  HPI: 28 year old male who I originally met in December 2024 for 1 year of ED and low libido.  He had borderline testosterone levels of 335 and 392.    After extensive counseling, he was strongly interested in treatment for his borderline levels with symptoms of low libido, fatigue, ED.  We opted for a trial of Clomid 25 mg daily, and Cialis 5 mg daily.  He has had excellent results on that medication and feels significantly improved.  Lab work is also improved with recent testosterone of 793, estradiol elevated at 71.  I recommended adding Arimidex 1 mg every other day to decrease the estradiol levels.  I also recommended sending his prescriptions to [email or web address] which would be more affordable.  Risks and benefits were discussed extensively.  -Continue Clomid 25 mg daily, add Arimidex 1 mg every other day for elevated estradiol.  Repeat labs 8 weeks -Continue Cialis 5 mg daily -If lab work improved, plan for yearly follow-up with T/estradiol levels and CBC/CMP  Sondra Come, MD  [facility] [address] [place], [place] [address] [phone]

## 2023-09-17 DIAGNOSIS — N529 Male erectile dysfunction, unspecified: Secondary | ICD-10-CM

## 2023-09-17 DIAGNOSIS — R6882 Decreased libido: Secondary | ICD-10-CM

## 2023-09-19 MED ORDER — CLOMIPHENE CITRATE 50 MG PO TABS: 25.0000 mg | ORAL_TABLET | Freq: Every day | ORAL | 6 refills | Status: DC

## 2023-09-19 NOTE — Telephone Encounter (Signed)
See mychart message. RX sent.  °

## 2023-11-02 ENCOUNTER — Other Ambulatory Visit
Admission: RE | Admit: 2023-11-02 | Discharge: 2023-11-02 | Disposition: A | Discharge status: Home / Self Care | Attending: Urology | Admitting: Urology

## 2023-11-02 DIAGNOSIS — N529 Male erectile dysfunction, unspecified: Secondary | ICD-10-CM | POA: Diagnosis present

## 2023-11-02 DIAGNOSIS — R6882 Decreased libido: Secondary | ICD-10-CM | POA: Insufficient documentation

## 2023-11-03 ENCOUNTER — Ambulatory Visit: Payer: Self-pay | Admitting: Urology

## 2023-11-03 DIAGNOSIS — N529 Male erectile dysfunction, unspecified: Secondary | ICD-10-CM

## 2023-11-03 DIAGNOSIS — R6882 Decreased libido: Secondary | ICD-10-CM

## 2023-11-03 LAB — ESTRADIOL: Estradiol: 42.5 pg/mL (ref 7.6–42.6)

## 2023-11-03 LAB — TESTOSTERONE: Testosterone: 674 ng/dL (ref 264–916)

## 2023-11-03 NOTE — Telephone Encounter (Signed)
Labs ordered, appt scheduled.

## 2024-01-03 DIAGNOSIS — N529 Male erectile dysfunction, unspecified: Secondary | ICD-10-CM

## 2024-01-03 DIAGNOSIS — R6882 Decreased libido: Secondary | ICD-10-CM

## 2024-01-03 MED ORDER — CLOMIPHENE CITRATE 50 MG PO TABS
25.0000 mg | ORAL_TABLET | Freq: Every day | ORAL | 3 refills | Status: AC
Start: 2024-01-03 — End: ?

## 2024-04-02 ENCOUNTER — Encounter: Payer: Self-pay | Admitting: Urology

## 2024-04-20 ENCOUNTER — Other Ambulatory Visit: Payer: Self-pay

## 2024-04-20 DIAGNOSIS — R6882 Decreased libido: Secondary | ICD-10-CM

## 2024-04-20 DIAGNOSIS — N529 Male erectile dysfunction, unspecified: Secondary | ICD-10-CM

## 2024-04-26 ENCOUNTER — Other Ambulatory Visit
Admission: RE | Admit: 2024-04-26 | Discharge: 2024-04-26 | Disposition: A | Discharge status: Home / Self Care | Attending: Urology | Admitting: Urology

## 2024-04-26 ENCOUNTER — Other Ambulatory Visit

## 2024-04-26 DIAGNOSIS — N529 Male erectile dysfunction, unspecified: Secondary | ICD-10-CM | POA: Insufficient documentation

## 2024-04-26 DIAGNOSIS — R6882 Decreased libido: Secondary | ICD-10-CM | POA: Diagnosis present

## 2024-04-26 LAB — CBC
HCT: 45.1 % (ref 39.0–52.0)
Hemoglobin: 14.9 g/dL (ref 13.0–17.0)
MCH: 28.2 pg (ref 26.0–34.0)
MCHC: 33 g/dL (ref 30.0–36.0)
MCV: 85.3 fL (ref 80.0–100.0)
Platelets: 230 K/uL (ref 150–400)
RBC: 5.29 MIL/uL (ref 4.22–5.81)
RDW: 12.4 % (ref 11.5–15.5)
WBC: 5.9 K/uL (ref 4.0–10.5)
nRBC: 0 % (ref 0.0–0.2)

## 2024-04-26 LAB — COMPREHENSIVE METABOLIC PANEL WITH GFR
ALT: 26 U/L (ref 0–44)
AST: 27 U/L (ref 15–41)
Albumin: 4.4 g/dL (ref 3.5–5.0)
Alkaline Phosphatase: 43 U/L (ref 38–126)
Anion gap: 11 (ref 5–15)
BUN: 12 mg/dL (ref 6–20)
CO2: 23 mmol/L (ref 22–32)
Calcium: 8.9 mg/dL (ref 8.9–10.3)
Chloride: 104 mmol/L (ref 98–111)
Creatinine, Ser: 0.88 mg/dL (ref 0.61–1.24)
GFR, Estimated: >60 mL/min (ref >60)
Glucose, Bld: 146 mg/dL — ABNORMAL HIGH (ref 70–99)
Potassium: 4.4 mmol/L (ref 3.5–5.1)
Sodium: 138 mmol/L (ref 135–145)
Total Bilirubin: 0.6 mg/dL (ref 0.0–1.2)
Total Protein: 6.6 g/dL (ref 6.5–8.1)

## 2024-04-27 LAB — ESTRADIOL: Estradiol: 24.3 pg/mL (ref 7.6–42.6)

## 2024-04-27 LAB — TESTOSTERONE: Testosterone: 651 ng/dL (ref 264–916)

## 2024-05-03 ENCOUNTER — Other Ambulatory Visit: Payer: Self-pay

## 2024-05-03 ENCOUNTER — Ambulatory Visit: Admitting: Urology

## 2024-05-03 VITALS — BP 121/75 | HR 66 | Ht 66.0 in | Wt 220.6 lb

## 2024-05-03 DIAGNOSIS — N529 Male erectile dysfunction, unspecified: Secondary | ICD-10-CM

## 2024-05-03 DIAGNOSIS — R6882 Decreased libido: Secondary | ICD-10-CM | POA: Diagnosis not present

## 2024-05-03 DIAGNOSIS — E291 Testicular hypofunction: Secondary | ICD-10-CM

## 2024-05-03 MED ORDER — CLOMIPHENE CITRATE 50 MG PO TABS: 25.0000 mg | ORAL_TABLET | Freq: Every day | ORAL | 3 refills | Status: AC

## 2024-05-03 MED ORDER — ANASTROZOLE 1 MG PO TABS: 1.0000 mg | ORAL_TABLET | ORAL | 1 refills | Status: AC

## 2024-05-03 NOTE — Progress Notes (Signed)
° °  05/03/2024 8:52 AM   William Mcgee 10/21/1995 [id number]  Reason for visit: Follow up hypogonadism, ED  History: Initial visit December 2024 with 1 year of ED, low libido, borderline testosterone  levels in the low 300s Opted for trial of Clomid  and Cialis  Estradiol  elevated and Arimidex  added January 2025  Physical Exam: BP 121/75 (BP Location: Left Arm, Patient Position: Sitting, Cuff Size: Large)   Pulse 66   Ht 5' 6 (1.676 m)   Wt 220 lb 9.6 oz (100.1 kg)   SpO2 95%   BMI 35.61 kg/m   Imaging/labs: Reviewed-December 2025 labs notable for normal testosterone  651, normal estradiol  24 on Clomid  and Arimidex   Today: Overall doing very well, good energy levels, excellent libido, ED well-controlled on Cialis  Attributes some nausea to when he takes the Arimidex   Plan:   Hypogonadism: Will continue Clomid  25 mg daily, decrease Arimidex  dose to 1 mg weekly and recheck T/estradiol  in 3 months.  Plan for yearly follow-up for TE/estradiol /H/H.  Behavioral strategies were also discussed. ED: Cialis  refilled   William JAYSON Burnet, MD  [facility] [address] [place], [place] [address] [phone]

## 2024-05-04 ENCOUNTER — Ambulatory Visit: Admitting: Urology

## 2024-08-01 ENCOUNTER — Other Ambulatory Visit

## 2024-08-02 ENCOUNTER — Other Ambulatory Visit

## 2025-04-25 ENCOUNTER — Other Ambulatory Visit

## 2025-05-02 ENCOUNTER — Ambulatory Visit: Admitting: Urology
